# Patient Record
Sex: Female | Born: 1975 | Race: Black or African American | Hispanic: No | Marital: Single | Smoking: Never smoker
Health system: Southern US, Community
[De-identification: ages and names within clinical notes are randomized; demographics above are authoritative.]

## PROBLEM LIST (undated history)

## (undated) DIAGNOSIS — G43909 Migraine, unspecified, not intractable, without status migrainosus: Secondary | ICD-10-CM

---

## 2006-07-18 ENCOUNTER — Emergency Department (HOSPITAL_COMMUNITY): Admission: EM | Admit: 2006-07-18 | Discharge: 2006-07-18 | Payer: Self-pay | Admitting: Family Medicine

## 2006-12-20 ENCOUNTER — Ambulatory Visit (HOSPITAL_BASED_OUTPATIENT_CLINIC_OR_DEPARTMENT_OTHER): Admission: RE | Admit: 2006-12-20 | Discharge: 2006-12-20 | Payer: Self-pay | Admitting: Ophthalmology

## 2010-11-07 NOTE — Op Note (Signed)
NAME:  Katrina Martin, Katrina Martin NO.:  [id number]   MEDICAL RECORD NO.:  [id number]          PATIENT TYPE:  AMB   LOCATION:  DSC                          FACILITY:  [facility]   PHYSICIAN:  Salley Scarlet., M.D.DATE OF BIRTH:  1975-08-25   DATE OF PROCEDURE:  12/20/2006  DATE OF DISCHARGE:                               OPERATIVE REPORT   PREOPERATIVE DIAGNOSIS:  Chalazion lower lid left eye.   POSTOPERATIVE DIAGNOSIS:  Chalazion lower lid left eye.   OPERATION:  Chalazion excision.   ANESTHESIA:  Local using Xylocaine 1%.   PROCEDURE:  The lower lid of the left eye was inspected and there was a  moderate sized chalazion located along the lid margin of the lateral  aspect of the lower lid of the left eye.  The patient was prepped and  draped in the usual manner.  Then the lower lid was infiltrated with  several ml of Xylocaine.  The chalazion clamp was applied over the  lesion and the lid was everted.  A cruciate incision was made in the  taraus over the lesion.  The lesion was curetted using a chalazion  curette.  The sac was excised in total using sharp and blunt dissection.  Ophthalmic ointment and a pressure patch was applied.  The patient  tolerated the procedure well and was discharged to the post anesthesia  recovery room in satisfactory condition.  She is instructed to rest  today, to take Vicodin every 4 hours as needed for pain, and see me in  the office in one week for further evaluation.  She is instructed to  remove the patch tomorrow, to cleanse the lid, and to resume her drops  four times a day.   DISCHARGE DIAGNOSIS:  Chalazion lower lid left eye.      Salley Scarlet., M.D.  Electronically Signed     TB/MEDQ  D:  12/21/2006  T:  12/21/2006  Job:  [id number]

## 2018-09-03 DIAGNOSIS — Z6836 Body mass index (BMI) 36.0-36.9, adult: Secondary | ICD-10-CM | POA: Diagnosis not present

## 2018-09-03 DIAGNOSIS — Z01419 Encounter for gynecological examination (general) (routine) without abnormal findings: Secondary | ICD-10-CM | POA: Diagnosis not present

## 2018-09-03 DIAGNOSIS — R350 Frequency of micturition: Secondary | ICD-10-CM | POA: Diagnosis not present

## 2018-09-03 DIAGNOSIS — Z124 Encounter for screening for malignant neoplasm of cervix: Secondary | ICD-10-CM | POA: Diagnosis not present

## 2018-09-03 DIAGNOSIS — Z1231 Encounter for screening mammogram for malignant neoplasm of breast: Secondary | ICD-10-CM | POA: Diagnosis not present

## 2018-09-05 ENCOUNTER — Other Ambulatory Visit: Payer: Self-pay | Admitting: Obstetrics and Gynecology

## 2018-09-05 DIAGNOSIS — R928 Other abnormal and inconclusive findings on diagnostic imaging of breast: Secondary | ICD-10-CM

## 2018-09-10 ENCOUNTER — Ambulatory Visit
Admission: RE | Admit: 2018-09-10 | Discharge: 2018-09-10 | Disposition: A | Discharge status: Home / Self Care | Payer: BLUE CROSS/BLUE SHIELD | Source: Ambulatory Visit | Attending: Obstetrics and Gynecology | Admitting: Obstetrics and Gynecology

## 2018-09-10 ENCOUNTER — Other Ambulatory Visit: Payer: Self-pay

## 2018-09-10 ENCOUNTER — Other Ambulatory Visit: Payer: Self-pay | Admitting: Obstetrics and Gynecology

## 2018-09-10 DIAGNOSIS — R928 Other abnormal and inconclusive findings on diagnostic imaging of breast: Secondary | ICD-10-CM | POA: Diagnosis not present

## 2018-09-10 DIAGNOSIS — N6313 Unspecified lump in the right breast, lower outer quadrant: Secondary | ICD-10-CM | POA: Diagnosis not present

## 2018-09-10 DIAGNOSIS — N6321 Unspecified lump in the left breast, upper outer quadrant: Secondary | ICD-10-CM | POA: Diagnosis not present

## 2018-12-02 DIAGNOSIS — Z20828 Contact with and (suspected) exposure to other viral communicable diseases: Secondary | ICD-10-CM | POA: Diagnosis not present

## 2018-12-05 ENCOUNTER — Other Ambulatory Visit: Payer: Self-pay

## 2018-12-05 ENCOUNTER — Encounter (HOSPITAL_BASED_OUTPATIENT_CLINIC_OR_DEPARTMENT_OTHER): Payer: Self-pay | Admitting: *Deleted

## 2018-12-05 ENCOUNTER — Emergency Department (HOSPITAL_BASED_OUTPATIENT_CLINIC_OR_DEPARTMENT_OTHER): Payer: BC Managed Care – PPO

## 2018-12-05 ENCOUNTER — Emergency Department (HOSPITAL_BASED_OUTPATIENT_CLINIC_OR_DEPARTMENT_OTHER)
Admission: EM | Admit: 2018-12-05 | Discharge: 2018-12-05 | Disposition: A | Discharge status: Home / Self Care | Payer: BC Managed Care – PPO | Attending: Emergency Medicine | Admitting: Emergency Medicine

## 2018-12-05 DIAGNOSIS — J069 Acute upper respiratory infection, unspecified: Secondary | ICD-10-CM | POA: Diagnosis not present

## 2018-12-05 DIAGNOSIS — B9789 Other viral agents as the cause of diseases classified elsewhere: Secondary | ICD-10-CM | POA: Diagnosis not present

## 2018-12-05 DIAGNOSIS — R0602 Shortness of breath: Secondary | ICD-10-CM | POA: Diagnosis not present

## 2018-12-05 DIAGNOSIS — R05 Cough: Secondary | ICD-10-CM | POA: Diagnosis not present

## 2018-12-05 HISTORY — DX: Migraine, unspecified, not intractable, without status migrainosus: G43.909

## 2018-12-05 LAB — BASIC METABOLIC PANEL
Anion gap: 8 (ref 5–15)
BUN: 7 mg/dL (ref 6–20)
CO2: 23 mmol/L (ref 22–32)
Calcium: 8.2 mg/dL — ABNORMAL LOW (ref 8.9–10.3)
Chloride: 108 mmol/L (ref 98–111)
Creatinine, Ser: 0.92 mg/dL (ref 0.44–1.00)
GFR calc Af Amer: >60 mL/min (ref >60)
GFR calc non Af Amer: >60 mL/min (ref >60)
Glucose, Bld: 96 mg/dL (ref 70–99)
Potassium: 3.7 mmol/L (ref 3.5–5.1)
Sodium: 139 mmol/L (ref 135–145)

## 2018-12-05 LAB — CBC WITH DIFFERENTIAL/PLATELET
Abs Immature Granulocytes: 0.01 10*3/uL (ref 0.00–0.07)
Basophils Absolute: 0 10*3/uL (ref 0.0–0.1)
Basophils Relative: 0 %
Eosinophils Absolute: 0 10*3/uL (ref 0.0–0.5)
Eosinophils Relative: 0 %
HCT: 41 % (ref 36.0–46.0)
Hemoglobin: 12.5 g/dL (ref 12.0–15.0)
Immature Granulocytes: 0 %
Lymphocytes Relative: 32 %
Lymphs Abs: 1.8 10*3/uL (ref 0.7–4.0)
MCH: 25.2 pg — ABNORMAL LOW (ref 26.0–34.0)
MCHC: 30.5 g/dL (ref 30.0–36.0)
MCV: 82.7 fL (ref 80.0–100.0)
Monocytes Absolute: 0.4 10*3/uL (ref 0.1–1.0)
Monocytes Relative: 6 %
Neutro Abs: 3.6 10*3/uL (ref 1.7–7.7)
Neutrophils Relative %: 62 %
Platelets: 204 10*3/uL (ref 150–400)
RBC: 4.96 MIL/uL (ref 3.87–5.11)
RDW: 14.7 % (ref 11.5–15.5)
WBC: 5.8 10*3/uL (ref 4.0–10.5)
nRBC: 0 % (ref 0.0–0.2)

## 2018-12-05 LAB — URINALYSIS, ROUTINE W REFLEX MICROSCOPIC
Glucose, UA: NEGATIVE mg/dL
Ketones, ur: 15 mg/dL — AB
Leukocytes,Ua: NEGATIVE
Nitrite: NEGATIVE
Protein, ur: 30 mg/dL — AB
Specific Gravity, Urine: 1.025 (ref 1.005–1.030)
pH: 6 (ref 5.0–8.0)

## 2018-12-05 LAB — URINALYSIS, MICROSCOPIC (REFLEX)

## 2018-12-05 LAB — PREGNANCY, URINE: Preg Test, Ur: NEGATIVE

## 2018-12-05 MED ORDER — SODIUM CHLORIDE 0.9 % IV BOLUS
500.0000 mL | Freq: Once | INTRAVENOUS | Status: AC
  Administered 2018-12-05: 500 mL via INTRAVENOUS

## 2018-12-05 MED ORDER — SODIUM CHLORIDE 0.9 % IV BOLUS
500.0000 mL | Freq: Once | INTRAVENOUS | Status: AC
  Administered 2018-12-05: 11:00:00 500 mL via INTRAVENOUS

## 2018-12-05 MED ORDER — ONDANSETRON 4 MG PO TBDP: ORAL_TABLET | ORAL | 0 refills | Status: AC

## 2018-12-05 NOTE — ED Notes (Signed)
Tried to get patient out of bed but she claimed that she still feel dizzy and nauseous.  IVF bolus infusing.

## 2018-12-05 NOTE — ED Notes (Signed)
ED Provider at bedside. 

## 2018-12-05 NOTE — ED Provider Notes (Addendum)
[facility] EMERGENCY DEPARTMENT Provider Note   CSN: [id number] Arrival date & time: 12/05/18  [id number]    History   Chief Complaint Chief Complaint  Patient presents with  . Weakness, cough, poor appetite    HPI Katrina Martin is a 43 y.o. female.     Patient is a 43 year old female with no significant past medical history who presents with cough and fatigue.  She states for the last week she is felt bad with generalized weakness and lightheadedness.  She has had some nausea but no vomiting.  She has a cough which is mostly at night.  She does have some shortness of breath at night during coughing spells and if she lies flat.  No shortness of breath during the day or on exertion.  No associated chest pain.  No abdominal pain.  No urinary symptoms.  No rhinorrhea.  No significant myalgias.  She was seen at a [facility] earlier in the week and had a negative COVID test which resulted yesterday.     Past Medical History:  Diagnosis Date  . Migraines     There are no active problems to display for this patient.   Past Surgical History:  Procedure Laterality Date  . CESAREAN SECTION       OB History    Gravida  4   Para  2   Term      Preterm      AB      Living        SAB      TAB      Ectopic      Multiple      Live Births               Home Medications    Prior to Admission medications   Medication Sig Start Date End Date Taking? Authorizing Provider  ondansetron (ZOFRAN ODT) 4 MG disintegrating tablet 4mg  ODT q4 hours prn nausea/vomit 12/05/18   Rolan BuccoBelfi, Tamecca Artiga, MD    Family History History reviewed. No pertinent family history.  Social History Social History   Tobacco Use  . Smoking status: Never Smoker  . Smokeless tobacco: Never Used  Substance Use Topics  . Alcohol use: Yes    Comment: occasionally  . Drug use: Never     Allergies   Morphine and related   Review of Systems Review of Systems   Constitutional: Positive for fatigue. Negative for chills, diaphoresis and fever.  HENT: Negative for congestion, rhinorrhea and sneezing.   Eyes: Negative.   Respiratory: Positive for cough and shortness of breath. Negative for chest tightness.   Cardiovascular: Negative for chest pain and leg swelling.  Gastrointestinal: Positive for nausea. Negative for abdominal pain, blood in stool, diarrhea and vomiting.  Genitourinary: Negative for difficulty urinating, flank pain, frequency and hematuria.  Musculoskeletal: Negative for arthralgias and back pain.  Skin: Negative for rash.  Neurological: Positive for weakness (generalized). Negative for dizziness, speech difficulty, numbness and headaches.     Physical Exam Updated Vital Signs BP 100/64 (BP Location: Left Arm)   Pulse 95   Temp 99 F (37.2 C) (Oral)   Resp 18   Ht 5\' 3"  (1.6 m)   Wt 90.3 kg   LMP 11/18/2018   SpO2 99%   BMI 35.25 kg/m   Physical Exam Constitutional:      Appearance: She is well-developed.  HENT:     Head: Normocephalic and atraumatic.  Eyes:  Pupils: Pupils are equal, round, and reactive to light.  Neck:     Musculoskeletal: Normal range of motion and neck supple.  Cardiovascular:     Rate and Rhythm: Normal rate and regular rhythm.     Heart sounds: Normal heart sounds.  Pulmonary:     Effort: Pulmonary effort is normal. No respiratory distress.     Breath sounds: Normal breath sounds. No wheezing or rales.  Chest:     Chest wall: No tenderness.  Abdominal:     General: Bowel sounds are normal.     Palpations: Abdomen is soft.     Tenderness: There is no abdominal tenderness. There is no guarding or rebound.  Musculoskeletal: Normal range of motion.        General: No swelling.  Lymphadenopathy:     Cervical: No cervical adenopathy.  Skin:    General: Skin is warm and dry.     Findings: No rash.  Neurological:     Mental Status: She is alert and oriented to person, place, and time.       ED Treatments / Results  Labs (all labs ordered are listed, but only abnormal results are displayed) Labs Reviewed  CBC WITH DIFFERENTIAL/PLATELET - Abnormal; Notable for the following components:      Result Value   MCH 25.2 (*)    All other components within normal limits  URINALYSIS, ROUTINE W REFLEX MICROSCOPIC - Abnormal; Notable for the following components:   APPearance CLOUDY (*)    Hgb urine dipstick TRACE (*)    Bilirubin Urine SMALL (*)    Ketones, ur 15 (*)    Protein, ur 30 (*)    All other components within normal limits  URINALYSIS, MICROSCOPIC (REFLEX) - Abnormal; Notable for the following components:   Bacteria, UA FEW (*)    All other components within normal limits  BASIC METABOLIC PANEL - Abnormal; Notable for the following components:   Calcium 8.2 (*)    All other components within normal limits  PREGNANCY, URINE    EKG None  Radiology Dg Chest 2 View  Result Date: 12/05/2018 CLINICAL DATA:  Cough and shortness of breath. EXAM: CHEST - 2 VIEW COMPARISON:  None. FINDINGS: The heart size and mediastinal contours are within normal limits. Both lungs are clear. The visualized skeletal structures are unremarkable. IMPRESSION: No active cardiopulmonary disease. Electronically Signed   By: Gerome Samavid  Williams III M.D   On: 12/05/2018 11:25    Procedures Procedures (including critical care time)  Medications Ordered in ED Medications  sodium chloride 0.9 % bolus 500 mL (0 mLs Intravenous Stopped 12/05/18 1203)  sodium chloride 0.9 % bolus 500 mL (0 mLs Intravenous Stopped 12/05/18 1353)     Initial Impression / Assessment and Plan / ED Course  I have reviewed the triage vital signs and the nursing notes.  Pertinent labs & imaging results that were available during my care of the patient were reviewed by me and considered in my medical decision making (see chart for details).        Patient is a 43 year old female who presents with cough and  fatigue.  She has no reported shortness of breath other than during coughing spells or laying down at night.  Her chest x-ray is clear without signs of pneumonia or fluid overload.  Her labs are non-concerning.  She has no evidence of urinary tract infection.  Her abdomen is nontender.  She was given IV fluids.  She has had no vomiting in the  ED.  She is tolerated graham crackers and soda.  She has a little nausea but no vomiting.  She is able to ambulate without ataxia.  She still feels generally weak.  I encouraged her to eat regularly and drink lots of fluids.  She was given a prescription for Zofran to use as needed.  Return precautions were needed.  She did have a negative Cova test and I did advise her that she still needs to maintain potential COVID  Precautions.  Return precautions were given.  Final Clinical Impressions(s) / ED Diagnoses   Final diagnoses:  Viral upper respiratory tract infection    ED Discharge Orders         Ordered    ondansetron (ZOFRAN ODT) 4 MG disintegrating tablet     12/05/18 1502           Malvin Johns, MD 12/05/18 1504    Malvin Johns, MD 12/05/18 1505

## 2018-12-05 NOTE — ED Notes (Signed)
Pt states remains slightly nauseous and weak but states ready for discharge.

## 2018-12-05 NOTE — ED Triage Notes (Addendum)
Patient stated that she has been coughing at night time, poor appetite, nauseous, dizzy and SOB for a week. This symptoms is intermittent/  [facility], [facility].  Patient stated that she started on a low carb diet a week and half ago.

## 2018-12-05 NOTE — ED Notes (Signed)
Only ate a few bites of graham crackers and a couple sips of soda.  She stated that she still felt nauseous.

## 2018-12-11 DIAGNOSIS — Z1389 Encounter for screening for other disorder: Secondary | ICD-10-CM | POA: Diagnosis not present

## 2018-12-11 DIAGNOSIS — Z20828 Contact with and (suspected) exposure to other viral communicable diseases: Secondary | ICD-10-CM | POA: Diagnosis not present

## 2018-12-11 DIAGNOSIS — B349 Viral infection, unspecified: Secondary | ICD-10-CM | POA: Diagnosis not present

## 2018-12-11 DIAGNOSIS — Z3200 Encounter for pregnancy test, result unknown: Secondary | ICD-10-CM | POA: Diagnosis not present

## 2018-12-12 DIAGNOSIS — J069 Acute upper respiratory infection, unspecified: Secondary | ICD-10-CM | POA: Diagnosis not present

## 2019-03-13 ENCOUNTER — Other Ambulatory Visit: Payer: Self-pay | Admitting: Obstetrics and Gynecology

## 2019-03-13 DIAGNOSIS — N63 Unspecified lump in unspecified breast: Secondary | ICD-10-CM

## 2019-03-17 ENCOUNTER — Other Ambulatory Visit: Payer: Self-pay | Admitting: Obstetrics and Gynecology

## 2019-03-17 ENCOUNTER — Other Ambulatory Visit: Payer: Self-pay

## 2019-03-17 ENCOUNTER — Ambulatory Visit
Admission: RE | Admit: 2019-03-17 | Discharge: 2019-03-17 | Disposition: A | Discharge status: Home / Self Care | Payer: BLUE CROSS/BLUE SHIELD | Source: Ambulatory Visit | Attending: Obstetrics and Gynecology | Admitting: Obstetrics and Gynecology

## 2019-03-17 DIAGNOSIS — N63 Unspecified lump in unspecified breast: Secondary | ICD-10-CM

## 2019-03-17 DIAGNOSIS — N6320 Unspecified lump in the left breast, unspecified quadrant: Secondary | ICD-10-CM

## 2019-03-17 DIAGNOSIS — N6489 Other specified disorders of breast: Secondary | ICD-10-CM | POA: Diagnosis not present

## 2019-03-18 ENCOUNTER — Ambulatory Visit
Admission: RE | Admit: 2019-03-18 | Discharge: 2019-03-18 | Disposition: A | Discharge status: Home / Self Care | Payer: BC Managed Care – PPO | Source: Ambulatory Visit | Attending: Obstetrics and Gynecology | Admitting: Obstetrics and Gynecology

## 2019-03-18 ENCOUNTER — Other Ambulatory Visit (HOSPITAL_COMMUNITY): Payer: Self-pay | Admitting: Diagnostic Radiology

## 2019-03-18 DIAGNOSIS — N62 Hypertrophy of breast: Secondary | ICD-10-CM | POA: Diagnosis not present

## 2019-03-18 DIAGNOSIS — N6320 Unspecified lump in the left breast, unspecified quadrant: Secondary | ICD-10-CM

## 2019-03-18 DIAGNOSIS — N6321 Unspecified lump in the left breast, upper outer quadrant: Secondary | ICD-10-CM | POA: Diagnosis not present

## 2019-10-07 ENCOUNTER — Other Ambulatory Visit: Payer: Self-pay | Admitting: Obstetrics and Gynecology

## 2019-10-07 DIAGNOSIS — N6489 Other specified disorders of breast: Secondary | ICD-10-CM

## 2019-11-03 ENCOUNTER — Other Ambulatory Visit: Payer: Self-pay

## 2019-11-03 ENCOUNTER — Ambulatory Visit
Admission: RE | Admit: 2019-11-03 | Discharge: 2019-11-03 | Disposition: A | Discharge status: Home / Self Care | Payer: BC Managed Care – PPO | Source: Ambulatory Visit | Attending: Obstetrics and Gynecology | Admitting: Obstetrics and Gynecology

## 2019-11-03 DIAGNOSIS — N6321 Unspecified lump in the left breast, upper outer quadrant: Secondary | ICD-10-CM | POA: Diagnosis not present

## 2019-11-03 DIAGNOSIS — N6313 Unspecified lump in the right breast, lower outer quadrant: Secondary | ICD-10-CM | POA: Diagnosis not present

## 2019-11-03 DIAGNOSIS — N6489 Other specified disorders of breast: Secondary | ICD-10-CM

## 2019-11-03 DIAGNOSIS — R928 Other abnormal and inconclusive findings on diagnostic imaging of breast: Secondary | ICD-10-CM | POA: Diagnosis not present

## 2019-12-09 DIAGNOSIS — Z01419 Encounter for gynecological examination (general) (routine) without abnormal findings: Secondary | ICD-10-CM | POA: Diagnosis not present

## 2019-12-09 DIAGNOSIS — Z6837 Body mass index (BMI) 37.0-37.9, adult: Secondary | ICD-10-CM | POA: Diagnosis not present

## 2020-03-28 ENCOUNTER — Other Ambulatory Visit: Payer: BC Managed Care – PPO

## 2020-03-28 DIAGNOSIS — Z20822 Contact with and (suspected) exposure to covid-19: Secondary | ICD-10-CM | POA: Diagnosis not present

## 2020-03-29 LAB — NOVEL CORONAVIRUS, NAA: SARS-CoV-2, NAA: NOT DETECTED

## 2020-03-29 LAB — SARS-COV-2, NAA 2 DAY TAT

## 2020-11-07 ENCOUNTER — Other Ambulatory Visit: Payer: Self-pay | Admitting: Obstetrics and Gynecology

## 2020-11-07 DIAGNOSIS — Z09 Encounter for follow-up examination after completed treatment for conditions other than malignant neoplasm: Secondary | ICD-10-CM

## 2020-12-01 ENCOUNTER — Other Ambulatory Visit: Payer: Self-pay | Admitting: Obstetrics and Gynecology

## 2020-12-01 DIAGNOSIS — N63 Unspecified lump in unspecified breast: Secondary | ICD-10-CM

## 2020-12-01 DIAGNOSIS — Z09 Encounter for follow-up examination after completed treatment for conditions other than malignant neoplasm: Secondary | ICD-10-CM

## 2020-12-06 ENCOUNTER — Other Ambulatory Visit: Payer: BC Managed Care – PPO

## 2020-12-06 ENCOUNTER — Ambulatory Visit: Payer: Self-pay

## 2020-12-06 ENCOUNTER — Ambulatory Visit
Admission: RE | Admit: 2020-12-06 | Discharge: 2020-12-06 | Disposition: A | Discharge status: Home / Self Care | Payer: Self-pay | Source: Ambulatory Visit | Attending: Obstetrics and Gynecology | Admitting: Obstetrics and Gynecology

## 2020-12-06 ENCOUNTER — Other Ambulatory Visit: Payer: Self-pay

## 2020-12-06 ENCOUNTER — Ambulatory Visit
Admission: RE | Admit: 2020-12-06 | Discharge: 2020-12-06 | Disposition: A | Discharge status: Home / Self Care | Payer: 59 | Source: Ambulatory Visit | Attending: Obstetrics and Gynecology | Admitting: Obstetrics and Gynecology

## 2020-12-06 DIAGNOSIS — N63 Unspecified lump in unspecified breast: Secondary | ICD-10-CM

## 2020-12-06 DIAGNOSIS — Z09 Encounter for follow-up examination after completed treatment for conditions other than malignant neoplasm: Secondary | ICD-10-CM

## 2022-02-13 IMAGING — MG DIGITAL DIAGNOSTIC BILAT W/ TOMO W/ CAD
8 of 14 series · 8 of 40 positions shown · non-contrast
Comparison: Previous exam(s).

CLINICAL DATA: Short-term follow-up for likely benign right breast
masses. She has 2 areas of benign PASH in the left breast which was
biopsied in Sunday February, 2019 and determined to be concordant. No
further follow-up was recommended for these masses.

EXAM:
DIGITAL DIAGNOSTIC BILATERAL MAMMOGRAM WITH TOMOSYNTHESIS AND CAD;
ULTRASOUND RIGHT BREAST LIMITED
TECHNIQUE: Bilateral digital diagnostic mammography and breast tomosynthesis
was performed. The images were evaluated with computer-aided
detection.; Targeted ultrasound examination of the right breast was
performed

[L MLO synth-2D (1 of 2)]
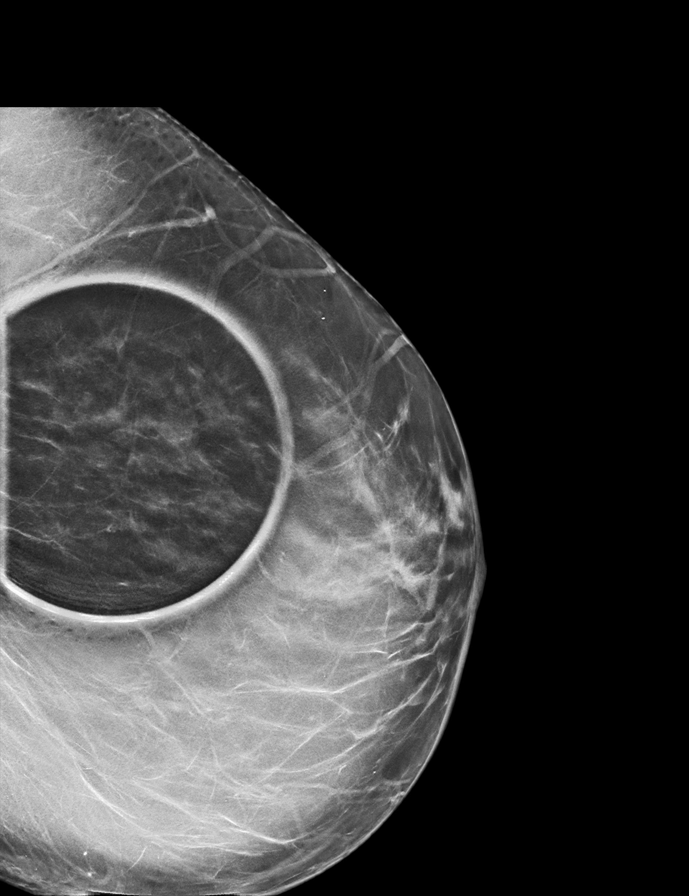

[L CC synth-2D (1 of 2)]
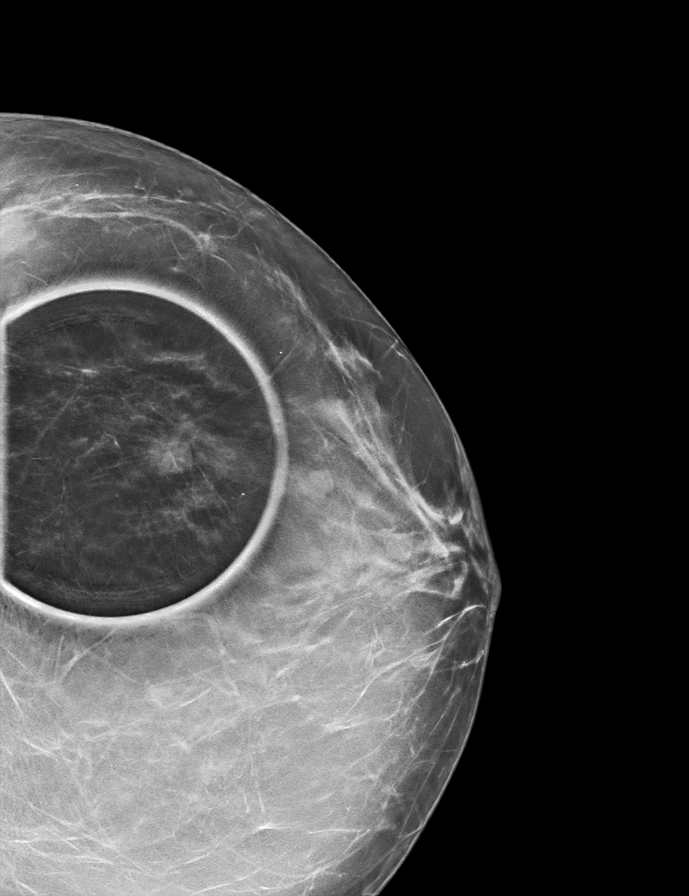

[L XCCL synth-2D]
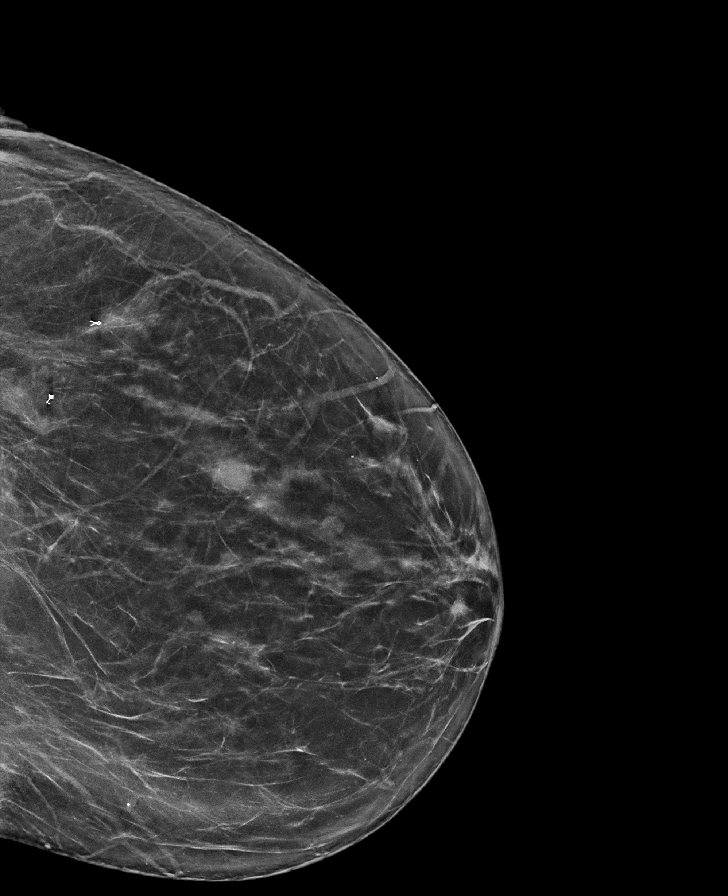

[R MLO synth-2D]
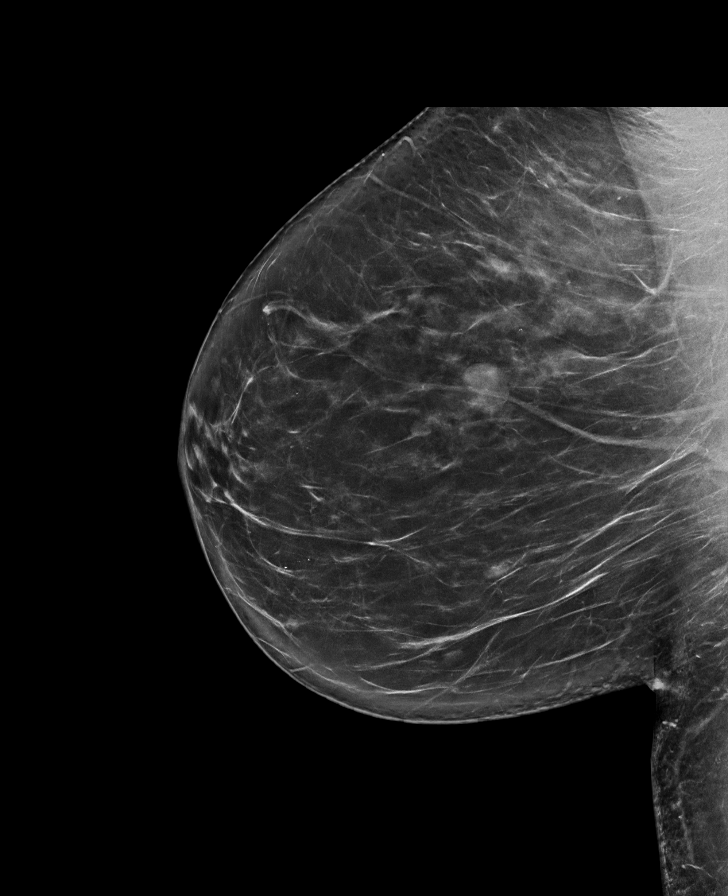

[L MLO synth-2D (2 of 2)]
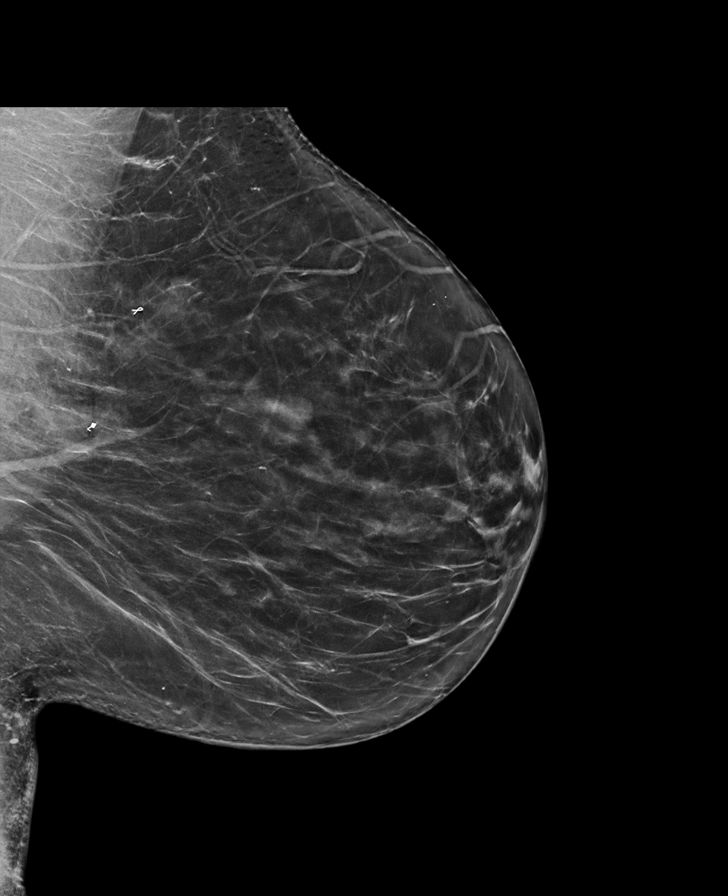

[R CC synth-2D]
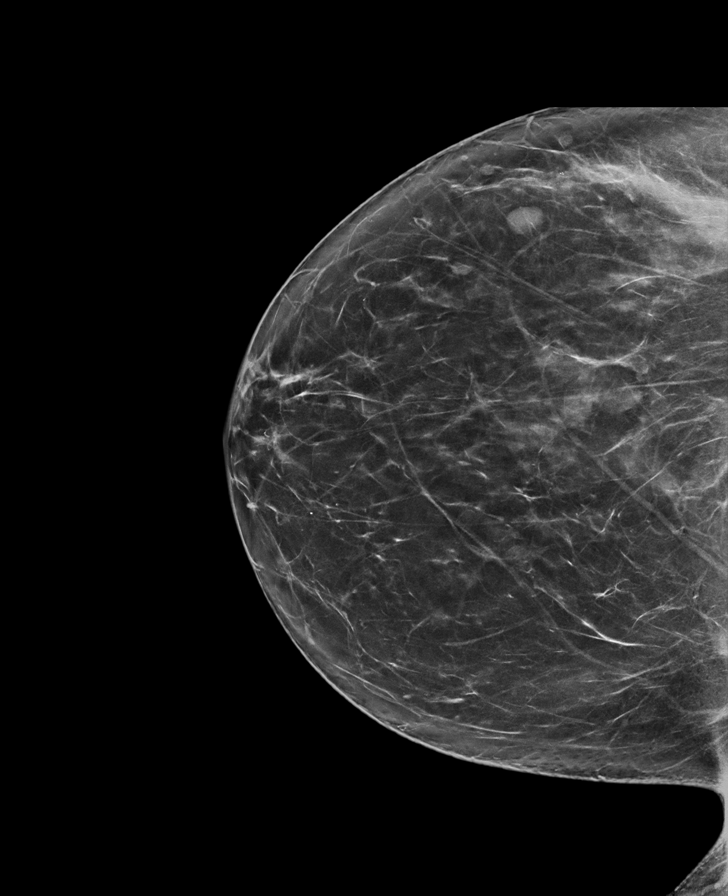

[L CC synth-2D (2 of 2)]
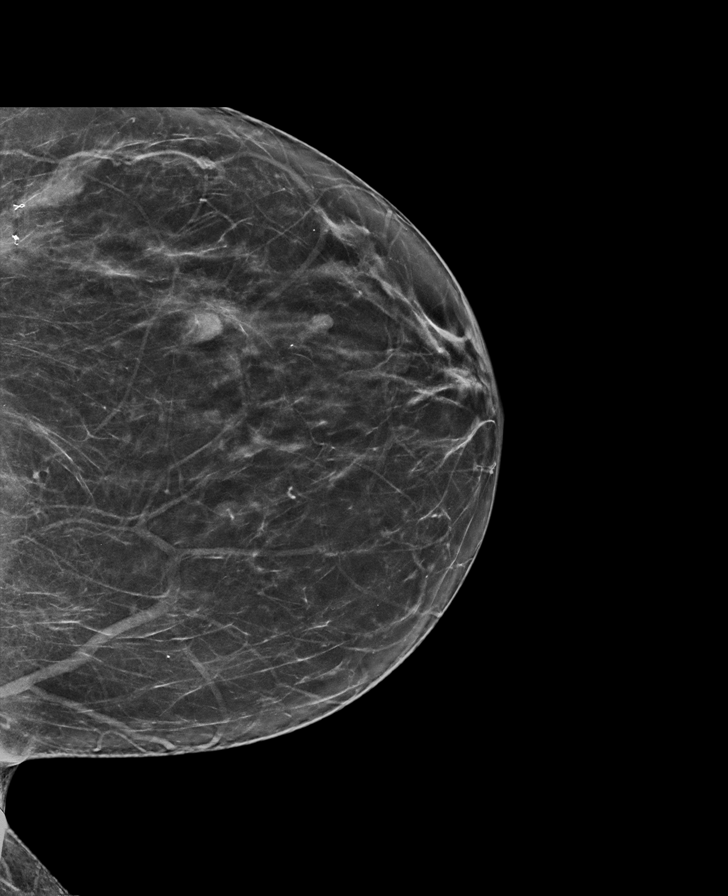

[R CC tomo · tomo slice 38/75.0]
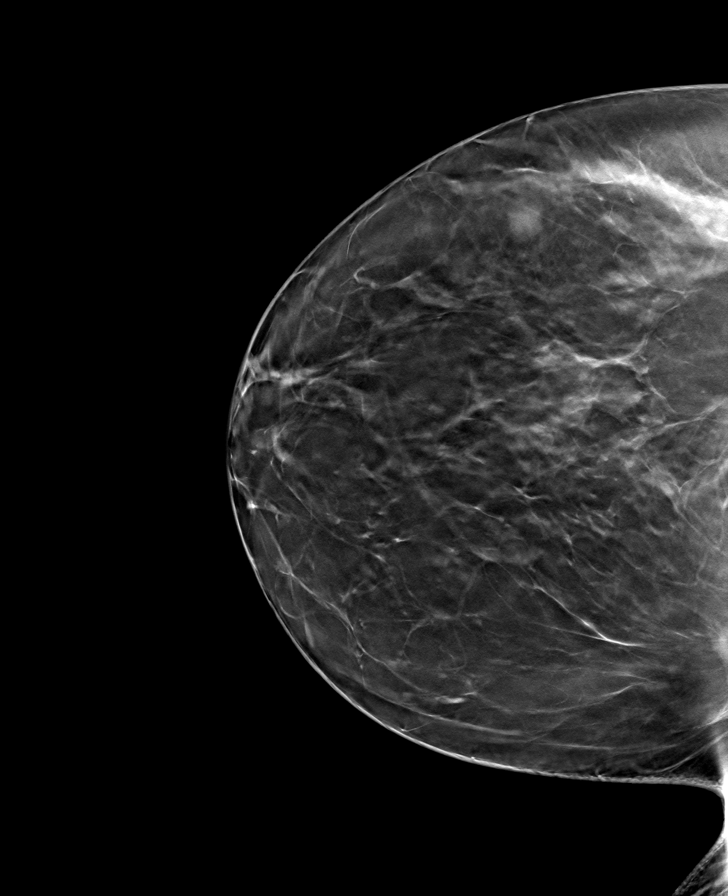

[8 of 40 positions shown; findings below may reference images not displayed]

ACR Breast Density Category b: There are scattered areas of
fibroglandular density.
FINDINGS: The 2 oval circumscribed masses in the lateral middle depth of the
right breast are mammographically stable. In the left breast, there
is a new oval mass in the lateral middle depth of the left breast.
On spot compression tomosynthesis imaging, the margins are
circumscribed. No further evaluation is needed as the patient has
multiple bilateral oval and circumscribed masses which is a benign
finding.

Ultrasound targeted to the right breast at 8 o'clock, 7 cm from the
nipple demonstrates a stable oval hypoechoic mass measuring 1.0 x
0.6 x 0.9 cm, previously measuring 1.0 x 0.5 x 0.9 cm. The small
adjacent mass at [DATE], 6 cm from the nipple measures 0.5 x 0.3 x
cm, previously measuring 0.5 x 0.3 x 0.5 cm.
IMPRESSION: 1. The 2 likely benign right breast masses have demonstrated 2 years
of stability, and are therefore benign.

2.  No mammographic evidence of malignancy in the bilateral breasts.

RECOMMENDATION:
Screening mammogram in one year.(Code:FY-B-B3W)

I have discussed the findings and recommendations with the patient.
If applicable, a reminder letter will be sent to the patient
regarding the next appointment.

BI-RADS CATEGORY  2: Benign.

## 2024-01-10 ENCOUNTER — Encounter: Payer: Self-pay | Admitting: Advanced Practice Midwife
